# Patient Record
Sex: Male | Born: 1980 | Hispanic: Yes | Smoking: Never smoker
Health system: Southern US, Community
[De-identification: ages and names within clinical notes are randomized; demographics above are authoritative.]

## PROBLEM LIST (undated history)

## (undated) DIAGNOSIS — G4733 Obstructive sleep apnea (adult) (pediatric): Secondary | ICD-10-CM

## (undated) DIAGNOSIS — T7840XA Allergy, unspecified, initial encounter: Secondary | ICD-10-CM

## (undated) HISTORY — DX: Obstructive sleep apnea (adult) (pediatric): G47.33

## (undated) HISTORY — DX: Allergy, unspecified, initial encounter: T78.40XA

---

## 2006-05-25 ENCOUNTER — Emergency Department (HOSPITAL_COMMUNITY): Admission: EM | Admit: 2006-05-25 | Discharge: 2006-05-25 | Payer: Self-pay | Admitting: Emergency Medicine

## 2010-09-09 ENCOUNTER — Ambulatory Visit
Admission: RE | Admit: 2010-09-09 | Discharge: 2010-09-09 | Disposition: A | Discharge status: Home / Self Care | Payer: No Typology Code available for payment source | Source: Ambulatory Visit | Attending: Emergency Medicine | Admitting: Emergency Medicine

## 2010-09-09 ENCOUNTER — Other Ambulatory Visit: Payer: Self-pay | Admitting: Emergency Medicine

## 2010-09-09 DIAGNOSIS — R51 Headache: Secondary | ICD-10-CM

## 2012-03-01 ENCOUNTER — Ambulatory Visit: Payer: Self-pay | Admitting: Internal Medicine

## 2012-03-01 VITALS — BP 98/62 | HR 72 | Temp 98.3°F | Resp 14 | Ht 66.0 in | Wt 184.0 lb

## 2012-03-01 DIAGNOSIS — L301 Dyshidrosis [pompholyx]: Secondary | ICD-10-CM

## 2012-03-01 DIAGNOSIS — R21 Rash and other nonspecific skin eruption: Secondary | ICD-10-CM

## 2012-03-01 DIAGNOSIS — L739 Follicular disorder, unspecified: Secondary | ICD-10-CM

## 2012-03-01 MED ORDER — METHYLPREDNISOLONE ACETATE 40 MG/ML INJ SUSP (RADIOLOG
120.0000 mg | Freq: Once | INTRAMUSCULAR | Status: AC
  Administered 2012-03-01: 120 mg via INTRAMUSCULAR

## 2012-03-01 MED ORDER — DOXYCYCLINE HYCLATE 100 MG PO TABS: 100.0000 mg | ORAL_TABLET | Freq: Two times a day (BID) | ORAL | Status: AC

## 2012-03-01 MED ORDER — CLOBETASOL PROP EMOLLIENT BASE 0.05 % EX CREA: TOPICAL_CREAM | CUTANEOUS | Status: DC

## 2012-03-01 NOTE — Patient Instructions (Signed)
Foliculitis (Folliculitis) La foliculitis es una infeccin e inflamacin de los folculos pilosos. Estos se vuelven rojos e irritados y forman lesiones con pus. La foliculitis puede curarse por s misma en algunas semanas o durar ms tiempo y Chartered loss adjuster. CAUSAS La causa ms comn de la foliculitis es la infeccin ocasionada por una bacteria (germen) Las infecciones virales y los hongos tambin pueden causar este trastorno. Las infecciones virales pueden ser ms comunes en aquellos con el sistema inmunolgico (sistema del organismo que ayuda a Industrial/product designer las enfermedades ) debilitado, como los que sufren [place], los que han sido sometidos a un transplante de rganos y a los pacientes con Database administrator. Los ejemplos incluyen personas con:  SIDA.   Transplante de rganos.   Cncer.  Las personas con el sistema inmune deprimido, diabetes u obesidad, tienen un riesgo mayor de contraer foliculitis que la poblacin en general. Ciertas sustancias qumicas, especialmente aceites y alquitrn tambin pueden causar este trastorno. SNTOMAS  Un signo temprano de foliculitis es una pequea lesin que pica (pstula), que contiene pus blanco o amarillento, en un folculo inflamado y rojo. Generalmente miden menos de 5 mm. (0.20 pulgadas).   El lugar ms probable de comienzo es el cuero cabelludo, muslos, piernas, espalda y [place]. La foliculitis tambin aparece con frecuencia en las zonas de rasurado repetido.   Cuando la infeccin de un folculo se hace ms profunda, se forma un divieso o fornculo. Un grupo de diviesos apiados crean una lesin ms grande denominada ntrax (en ingls se denomina carbuncle). Las lesiones tienden a producirse en reas de sudoracin y que tienen gran cantidad de vello.  TRATAMIENTO  Generalmente los dermatlogos tratan los casos leves de foliculitis con antispticos combinados con antibiticos tpicos (una sustancia que se aplica en la piel y que destruye grmenes).   El  aceite de [address] o del rbol de t, que se obtiene en las herboristeras, tambin es un excelente antisptico tpico. Un pequeo porcentaje de individuos puede desarrollar alergia a este aceite.   Los fornculos de tamao pequeo o moderado responden bien a las compresas de agua tibia aplicadas tres veces por Futures trader.   En algunos casos el tratamiento de la piel deber acompaarse de antibiticos por va oral.   La eleccin del antibitico se realiza en base a la causa que se sospecha. Si las lesiones contienen grandes cantidades de pus o lquido, el profesional que lo asiste podr drenarlos. Esto permite que el antibitico llegue [employer] reas afectadas.   Los casos de foliculitis rebelde pueden responder bien a la extirpacin del pelo con Production manager. En este procedimiento se Aeronautical engineer (un haz de luz de alta intensidad) para destruir el folculo. De este modo se reducen las cicatrices que son resultado de este trastorno. No obstante, en el rea tratada no volver a crecer pelo.  Los pacientes que sufren foliculitis desde hace mucho tiempo y que no responde al tratamiento, pueden requerir estudios para Artist origen de la infeccin (de dnde proviene). Grmenes puede vivir en los orificios nasales del paciente, y Corporate investment banker brotes de manera intermitente. Algunas veces las bacterias viven en las fosas nasales de un miembro de la familia que no desarrolla la enfermedad, pero expone repetidamente al paciente a tener contacto con el germen. Para cortar este ciclo de recurrencia, ese familiar tambin debe someterse al tratamiento. PREVENCIN  Los individuos predispuestos a la foliculitis debern ser extremadamente cuidadosos en su higiene personal.   La aplicacin de un antisptico puede ayudar a prevenir  las recurrencias.   Una crema tpica con antibitico, mupirocina (Bactroban), ha demostrado su efectividad en la reduccin de la colonizacin bacteriana de las fosas nasales. Se aplica  dentro de la nariz con el dedo [place], [employer] por da, durante [place], y deber repetirse cada 6 meses.   Una persona que se expone con frecuencia al aceite, alquitrn o a otras sustancias qumicas irritantes deber evitarlas o usar Geologist, engineering. Los pacientes con diabetes, obesidad o compromiso del sistema inmunolgico (no funciona como debera Media planner), tienen que saber que la foliculitis es una consecuencia posible de su trastorno.  SOLICITE ATENCIN MDICA DE INMEDIATO SI:  Presenta enrojecimiento, hinchazn o aumento del dolor en la zona.   Tiene fiebre.   No mejora con el tratamiento o empeora.   Tiene otras preguntas o preocupaciones.  Document Released: 06/05/2005 Document Revised: 05/25/2011 [facility] Patient Information 2012 [place], [place].

## 2012-03-01 NOTE — Progress Notes (Signed)
  Subjective:    Patient ID: Adam Rojas, male    DOB: Aug 24, 1980, 31 y.o.   MRN: [id number]  HPI Itchy red bumps on palms, dorsal hands, right arm and axilla. No mouth lesions, No fever, not sick   Review of Systems     Objective:   Physical Exam Hands have tiny fluid vesicles, very itchy Arms have red papules and some with pustules       Assessment & Plan:  Folliculitis Dyshydrosis

## 2015-12-30 ENCOUNTER — Ambulatory Visit (INDEPENDENT_AMBULATORY_CARE_PROVIDER_SITE_OTHER): Payer: 59 | Admitting: Urgent Care

## 2015-12-30 VITALS — BP 118/80 | HR 54 | Temp 97.8°F | Resp 16 | Ht 67.0 in | Wt 177.0 lb

## 2015-12-30 DIAGNOSIS — M25531 Pain in right wrist: Secondary | ICD-10-CM

## 2015-12-30 DIAGNOSIS — G5603 Carpal tunnel syndrome, bilateral upper limbs: Secondary | ICD-10-CM | POA: Diagnosis not present

## 2015-12-30 DIAGNOSIS — M25532 Pain in left wrist: Secondary | ICD-10-CM

## 2015-12-30 MED ORDER — NAPROXEN SODIUM 550 MG PO TABS: 550.0000 mg | ORAL_TABLET | Freq: Two times a day (BID) | ORAL | Status: DC

## 2015-12-30 NOTE — Patient Instructions (Addendum)
Sndrome del tnel carpiano (Carpal Tunnel Syndrome) El sndrome del tnel carpiano es una afeccin que causa dolor en la mano y en el brazo. El tnel carpiano es un espacio estrecho ubicado en el lado palmar de la [place]. Los movimientos de la [place] o ciertas enfermedades pueden causar hinchazn del tnel. Esta hinchazn comprime el nervio principal de la mueca (nervio mediano). CAUSAS  Esta afeccin puede ser causada por lo siguiente:   Movimientos repetidos de [employer].  Lesiones en la [place].  Artritis.  Un quiste o un tumor en el tnel carpiano.  Acumulacin de lquido Solicitor. A veces, se desconoce la causa de esta afeccin.  FACTORES DE RIESGO Es ms probable que esta afeccin se manifieste en:   Las personas que tienen trabajos en los que deben [employer] mismos movimientos repetidos de las [place], como los carniceros y los cajeros.  Las mujeres.  Las personas que tienen determinadas enfermedades, por ejemplo:  Diabetes.  Obesidad.  Tiroides hipoactiva (hipotiroidismo).  Insuficiencia renal. SNTOMAS  Los sntomas de esta afeccin incluyen lo siguiente:   Sensacin de hormigueo en los dedos de la mano, Production assistant, radio, el ndice y el dedo [place].  Hormigueo o adormecimiento en la mano.  Sensacin de Social research officer, government en todo el brazo, especialmente cuando la [place] y el codo estn flexionados durante [place].  Dolor en la mueca que sube por el brazo hasta el hombro.  Dolor que baja por la mano o los dedos.  Sensacin de [employer]. Tal vez tenga dificultad para tomar y Licensed conveyancer. Los sntomas pueden empeorar durante la noche.  DIAGNSTICO  Esta afeccin se diagnostica mediante la historia clnica y un examen fsico. Tambin pueden hacerle exmenes, que incluyen los siguientes:   Electromiografa (EMG). Esta prueba mide las seales elctricas que los nervios les envan a los msculos.  Radiografas. TRATAMIENTO  El  tratamiento de esta afeccin incluye lo siguiente:  Cambios en el estilo de vida. Es importante dejar de Field seismologist o modificar la actividad que caus la afeccin.  Fisioterapia o terapia ocupacional.  Analgsicos y antiinflamatorios. Esto puede incluir medicamentos que se inyectan en la [place].  Una frula para la [place].  [place]uga. INSTRUCCIONES PARA EL CUIDADO EN EL HOGAR  Si tiene una frula:  sela como se lo haya indicado el mdico. Qutesela solamente como se lo haya indicado el mdico.  Afloje la frula si los dedos se le entumecen, siente hormigueos o se le enfran y se tornan de Optician, dispensing.  Mantenga la frula limpia y seca. Instrucciones generales  Delphi de venta libre y los recetados solamente como se lo haya indicado el mdico.  Haga reposar la [place] de toda actividad que le cause dolor. Si la afeccin tiene relacin con Leander Rams, hable con su empleador [employer] pueden [place], por [place], usar una almohadilla para apoyar la mueca mientras tipea.  Si se lo indican, aplique hielo sobre la zona dolorida:  Ponga el hielo en una bolsa plstica.  Coloque una toalla entre la piel y la bolsa de hielo.  Coloque el hielo durante 72minutos, 2 a 3veces por Training and development officer.  Concurra a todas las visitas de control como se lo haya indicado el mdico. Esto es importante.  Haga los ejercicios como se lo hayan indicado el mdico, el fisioterapeuta o el terapeuta ocupacional. SOLICITE ATENCIN MDICA SI:   Aparecen nuevos sntomas.  El dolor no se alivia con los [employer].  Los sntomas empeoran.  Esta informacin no tiene Marine scientist el consejo del mdico. Asegrese de hacerle al mdico cualquier pregunta que tenga.   Document Released: 06/05/2005 Document Revised: 02/24/2015 Elsevier Interactive Patient Education [employer].     IF you received an x-ray today, you will receive an invoice from [facility]. Please contact  [facility] at [phone] with questions or concerns regarding your invoice.   IF you received labwork today, you will receive an invoice from [employer]. Please contact [facility] at [phone] with questions or concerns regarding your invoice.   Our billing staff will not be able to assist you with questions regarding bills from these companies.  You will be contacted with the lab results as soon as they are available. The fastest way to get your results is to activate your My Chart account. Instructions are located on the last page of this paperwork. If you have not heard from [place] regarding the results in 2 weeks, please contact this office.

## 2015-12-30 NOTE — Progress Notes (Signed)
    MRN: [id number] DOB: 06/16/1981  Subjective:   Adam Rojas is a 35 y.o. male presenting for chief complaint of Hand Pain  Reports ~1 month history of bilateral worsening hand pain, L>R. Pain is located over wrist up to palm, is like a stiffness and pressure type sensation. Pain is worst at night after work but also occurs in the morning. Also has numbness and tingling of his left index and middle finger. Has tried APAP with minimal relief. Of note, his work is with Estate manager/land agent, had his work tasks changed recently to a lot of pulling, grasping tightly for long periods of time. Denies trauma, bony deformity, swelling, redness.  Antwan currently has no medications in their medication list. Also has No Known Allergies.  Dannel  has no past medical history on file. Also  has no past surgical history on file.  Objective:   Vitals: BP 118/80 mmHg  Pulse 54  Temp(Src) 97.8 F (36.6 C) (Oral)  Resp 16  Ht 5\' 7"  (1.702 m)  Wt 177 lb (80.287 kg)  BMI 27.72 kg/m2  SpO2 99%  Physical Exam  Constitutional: He is oriented to person, place, and time. He appears well-developed and well-nourished.  Cardiovascular: Normal rate.   Pulmonary/Chest: Effort normal.  Musculoskeletal:       Right wrist: He exhibits tenderness (over carpal tunnel, positive Tinnel). He exhibits normal range of motion, no bony tenderness, no swelling, no effusion, no crepitus, no deformity and no laceration.       Left wrist: He exhibits decreased range of motion (full flexion) and tenderness (over carpal tunnel, positive Tinnel). He exhibits no bony tenderness, no swelling, no effusion, no crepitus, no deformity and no laceration.  Neurological: He is alert and oriented to person, place, and time.  Skin: Skin is warm and dry.    Assessment and Plan :   1. Bilateral carpal tunnel syndrome 2. Pain in both wrists - Will manage conservatively for now. Wear wrist splint at night. Use Anaprox for pain and  inflammation. Work restrictions provided. F/u in 1 week, consider referral to OT or Ortho.  Jaynee Eagles, PA-C [facility] [phone] 12/30/2015 8:25 AM

## 2016-01-07 ENCOUNTER — Encounter: Payer: Self-pay | Admitting: Urgent Care

## 2016-01-07 ENCOUNTER — Ambulatory Visit (INDEPENDENT_AMBULATORY_CARE_PROVIDER_SITE_OTHER): Payer: 59 | Admitting: Urgent Care

## 2016-01-07 VITALS — BP 102/68 | HR 62 | Temp 98.1°F | Ht 67.0 in | Wt 183.0 lb

## 2016-01-07 DIAGNOSIS — G5603 Carpal tunnel syndrome, bilateral upper limbs: Secondary | ICD-10-CM | POA: Diagnosis not present

## 2016-01-07 DIAGNOSIS — R202 Paresthesia of skin: Secondary | ICD-10-CM | POA: Diagnosis not present

## 2016-01-07 DIAGNOSIS — M25531 Pain in right wrist: Secondary | ICD-10-CM | POA: Diagnosis not present

## 2016-01-07 DIAGNOSIS — R200 Anesthesia of skin: Secondary | ICD-10-CM

## 2016-01-07 DIAGNOSIS — M25532 Pain in left wrist: Secondary | ICD-10-CM | POA: Diagnosis not present

## 2016-01-07 NOTE — Progress Notes (Signed)
    MRN: [id number] DOB: 01-09-1981  Subjective:   Adam Rojas is a 35 y.o. male presenting for follow up on carpal tunnel syndrome.   Patient was initially seen on 12/30/2015, started conservative management, wrist splints, provided with work restrictions. Today, he reports doing much better. His wrist pain is resolved, still has some tingling in his left middle and index fingers but this has also improved. He has been using wrist splint and taking Anaprox. However, he noticed dramatic improvement when he stopped working his previous job. Unfortunately, he had to quit since his employer refused to follow the work restrictions. Fortunately, he will be starting a new job that he expects will be much lighter on his wrists.  Kelly has a current medication list which includes the following prescription(s): naproxen sodium. Also has No Known Allergies.  Herson  has no past medical history on file. Also  has no past surgical history on file.  Objective:   Vitals: BP 102/68 mmHg  Pulse 62  Temp(Src) 98.1 F (36.7 C) (Oral)  Ht 5\' 7"  (1.702 m)  Wt 183 lb (83.008 kg)  BMI 28.66 kg/m2  SpO2 98%  Physical Exam  Constitutional: He is oriented to person, place, and time. He appears well-developed and well-nourished.  Cardiovascular: Normal rate.   Pulmonary/Chest: Effort normal.  Musculoskeletal:       Right wrist: He exhibits normal range of motion, no tenderness, no bony tenderness, no swelling, no effusion, no crepitus, no deformity and no laceration.       Left wrist: He exhibits normal range of motion, no tenderness, no bony tenderness, no swelling, no effusion, no crepitus, no deformity and no laceration.  Positive Tinnel test for left and right wrist.  Neurological: He is alert and oriented to person, place, and time.   Assessment and Plan :   1. Bilateral carpal tunnel syndrome 2. Pain in both wrists 3. Numbness and tingling - Improved, continue conservative management, wear  wrist splint. Patient will let me know if his tingling of his left 2nd and 3rd fingers persists. Consider PT at that point. Patient agreed.  Jaynee Eagles, PA-C [facility] [phone] 01/07/2016 8:48 AM

## 2016-01-07 NOTE — Patient Instructions (Addendum)
Sndrome del tnel carpiano (Carpal Tunnel Syndrome) El sndrome del tnel carpiano es una afeccin que causa dolor en la mano y en el brazo. El tnel carpiano es un espacio estrecho ubicado en el lado palmar de la [place]. Los movimientos de la [place] o ciertas enfermedades pueden causar hinchazn del tnel. Esta hinchazn comprime el nervio principal de la mueca (nervio mediano). CAUSAS  Esta afeccin puede ser causada por lo siguiente:   Movimientos repetidos de [employer].  Lesiones en la [place].  Artritis.  Un quiste o un tumor en el tnel carpiano.  Acumulacin de lquido Solicitor. A veces, se desconoce la causa de esta afeccin.  FACTORES DE RIESGO Es ms probable que esta afeccin se manifieste en:   Las personas que tienen trabajos en los que deben [employer]s mismos movimientos repetidos de las [place], como los carniceros y los cajeros.  Las mujeres.  Las personas que tienen determinadas enfermedades, por ejemplo:  Diabetes.  Obesidad.  Tiroides hipoactiva (hipotiroidismo).  Insuficiencia renal. SNTOMAS  Los sntomas de esta afeccin incluyen lo siguiente:   Sensacin de hormigueo en los dedos de la mano, Production assistant, radio, el ndice y el dedo [place].  Hormigueo o adormecimiento en la mano.  Sensacin de Social research officer, government en todo el brazo, especialmente cuando la [place] y el codo estn flexionados durante [place].  Dolor en la mueca que sube por el brazo hasta el hombro.  Dolor que baja por la mano o los dedos.  Sensacin de [employer]vin[employer]itor. Tal vez tenga dificultad para tomar y Licensed conveyancer. Los sntomas pueden empeorar durante la noche.  DIAGNSTICO  Esta afeccin se diagnostica mediante la historia clnica y un examen fsico. Tambin pueden hacerle exmenes, que incluyen los siguientes:   Electromiografa (EMG). Esta prueba mide las seales elctricas que los nervios les envan a los msculos.  Radiografas. TRATAMIENTO  El  tratamiento de esta afeccin incluye lo siguiente:  Cambios en el estilo de vida. Es importante dejar de Field seismologist o modificar la actividad que caus la afeccin.  Fisioterapia o terapia ocupacional.  Analgsicos y antiinflamatorios. Esto puede incluir medicamentos que se inyectan en la [place].  Una frula para la [place].  [place]. INSTRUCCIONES PARA EL CUIDADO EN EL HOGAR  Si tiene una frula:  sela como se lo haya indicado el mdico. Qutesela solamente como se lo haya indicado el mdico.  Afloje la frula si los dedos se le entumecen, siente hormigueos o se le enfran y se tornan de Optician, dispensing.  Mantenga la frula limpia y seca. Instrucciones generales  Delphi de venta libre y los recetados solamente como se lo haya indicado el mdico.  Haga reposar la [place] de toda actividad que le cause dolor. Si la afeccin tiene relacin con Leander Rams, hable con su empleador [employer] Financial pueden [place], por [place], usar una almohadilla para apoyar la mueca mientras tipea.  Si se lo indican, aplique hielo sobre la zona dolorida:  Ponga el hielo en una bolsa plstica.  Coloque una toalla entre la piel y la bolsa de hielo.  Coloque el hielo durante 48minutos, 2 a 3veces por Training and development officer.  Concurra a todas las visitas de control como se lo haya indicado el mdico. Esto es importante.  Haga los ejercicios como se lo hayan indicado el mdico, el fisioterapeuta o el terapeuta ocupacional. SOLICITE ATENCIN MDICA SI:   Aparecen nuevos sntomas.  El dolor no se alivia con los [employer]ne[employer].  Los sntomas empeoran.  Esta informacin no tiene Marine scientist el consejo del mdico. Asegrese de hacerle al mdico cualquier pregunta que tenga.   Document Released: 06/05/2005 Document Revised: 02/24/2015 Elsevier Interactive Patient Education [employer].     IF you received an x-ray today, you will receive an invoice from [facility] Rad[facility]. Please contact  [facility]iology at [phone] with questions or concerns regarding your invoice.   IF you received labwork today, you will receive an invoice from [employer]. Please contact [facility] at [phone] with questions or concerns regarding your invoice.   Our billing staff will not be able to assist you with questions regarding bills from these companies.  You will be contacted with the lab results as soon as they are available. The fastest way to get your results is to activate your My Chart account. Instructions are located on the last page of this paperwork. If you have not heard from [place] regarding the results in 2 weeks, please contact this office.

## 2018-05-20 ENCOUNTER — Ambulatory Visit (INDEPENDENT_AMBULATORY_CARE_PROVIDER_SITE_OTHER): Payer: BLUE CROSS/BLUE SHIELD

## 2018-05-20 ENCOUNTER — Other Ambulatory Visit: Payer: Self-pay

## 2018-05-20 ENCOUNTER — Ambulatory Visit (INDEPENDENT_AMBULATORY_CARE_PROVIDER_SITE_OTHER): Payer: BLUE CROSS/BLUE SHIELD | Admitting: Family Medicine

## 2018-05-20 ENCOUNTER — Encounter: Payer: Self-pay | Admitting: Family Medicine

## 2018-05-20 VITALS — BP 120/75 | HR 63 | Temp 98.6°F | Ht 67.0 in | Wt 179.8 lb

## 2018-05-20 DIAGNOSIS — Z23 Encounter for immunization: Secondary | ICD-10-CM

## 2018-05-20 DIAGNOSIS — M25571 Pain in right ankle and joints of right foot: Secondary | ICD-10-CM | POA: Diagnosis not present

## 2018-05-20 DIAGNOSIS — S99911A Unspecified injury of right ankle, initial encounter: Secondary | ICD-10-CM | POA: Diagnosis not present

## 2018-05-20 MED ORDER — IBUPROFEN 600 MG PO TABS: 600.0000 mg | ORAL_TABLET | Freq: Three times a day (TID) | ORAL | 0 refills | Status: DC | PRN

## 2018-05-20 NOTE — Patient Instructions (Addendum)
     If you have lab work done today you will be contacted with your lab results within the next 2 weeks.  If you have not heard from us then please contact us. The fastest way to get your results is to register for My Chart.   IF you received an x-ray today, you will receive an invoice from [facility]. Please contact [facility] at [phone] with questions or concerns regarding your invoice.   IF you received labwork today, you will receive an invoice from [facility]. Please contact [facility] at [phone] with questions or concerns regarding your invoice.   Our billing staff will not be able to assist you with questions regarding bills from these companies.  You will be contacted with the lab results as soon as they are available. The fastest way to get your results is to activate your My Chart account. Instructions are located on the last page of this paperwork. If you have not heard from us regarding the results in 2 weeks, please contact this office.     Ankle Sprain An ankle sprain is a stretch or tear in one of the tough tissues (ligaments) in your ankle. Follow these instructions at home:  Rest your ankle.  Take over-the-counter and prescription medicines only as told by your doctor.  For 2-3 days, keep your ankle higher than the level of your heart (elevated) as much as possible.  If directed, put ice on the area: ? Put ice in a plastic bag. ? Place a towel between your skin and the bag. ? Leave the ice on for 20 minutes, 2-3 times a day.  If you were given a brace: ? Wear it as told. ? Take it off to shower or bathe. ? Try not to move your ankle much, but wiggle your toes from time to time. This helps to prevent swelling.  If you were given an elastic bandage (dressing): ? Take it off when you shower or bathe. ? Try not to move your ankle much, but wiggle your toes from time to time. This helps to prevent swelling. ? Adjust the bandage to make it  more comfortable if it feels too tight. ? Loosen the bandage if you lose feeling in your foot, your foot tingles, or your foot gets cold and blue.  If you have crutches, use them as told by your doctor. Continue to use them until you can walk without feeling pain in your ankle. Contact a doctor if:  Your bruises or swelling are quickly getting worse.  Your pain does not get better after you take medicine. Get help right away if:  You cannot feel your toes or foot.  Your toes or your foot looks blue.  You have very bad pain that gets worse. This information is not intended to replace advice given to you by your health care provider. Make sure you discuss any questions you have with your health care provider. Document Released: 11/22/2007 Document Revised: 11/11/2015 Document Reviewed: 01/05/2015 Elsevier Interactive Patient Education  2018 Elsevier Inc.       

## 2018-05-20 NOTE — Progress Notes (Signed)
   12/2/20193:01 PM  Adam Rojas [birth year], 37 y.o. male [id number]  Chief Complaint  Patient presents with  . Pain    fell last night gong down steps, thinks he may have sprained right anke. Taking tylenol for the pain, and elevation    HPI:   Patient is a 37 y.o. male who presents today for right ankle pain  Last night going down steps, missed step and fell Tried to walk on it but very painful, needed help getting back inside  Pain along both sides of ankle Having swelling He has been elevating  Took tylenol No previous injuries Unable to bear weight on this ankle  Fall Risk  05/20/2018 01/07/2016 12/30/2015  Falls in the past year? 1 No No  Number falls in past yr: 0 - -  Injury with Fall? 1 - -     Depression screen [facility] 2/9 05/20/2018 01/07/2016 12/30/2015  Decreased Interest 0 0 0  Down, Depressed, Hopeless 0 0 0  PHQ - 2 Score 0 0 0    No Known Allergies  Prior to Admission medications   Not on File    History reviewed. No pertinent past medical history.  History reviewed. No pertinent surgical history.  Social History   Tobacco Use  . Smoking status: Never Smoker  . Smokeless tobacco: Never Used  Substance Use Topics  . Alcohol use: Not Currently    Alcohol/week: 0.0 standard drinks    History reviewed. No pertinent family history.  ROS Per hpi  OBJECTIVE:  Blood pressure 120/75, pulse 63, temperature 98.6 F (37 C), temperature source Oral, height 5\' 7"  (1.702 m), weight 179 lb 12.8 oz (81.6 kg), SpO2 96 %. Body mass index is 28.16 kg/m.   Physical Exam  Gen: AAOx3, NAD Right ankle: swelling along lateral aspect. TTP along distal end of both malleolus. Decreased ROM with inversion. NVI  Dg Ankle Complete Right  Result Date: 05/20/2018 CLINICAL DATA:  [place] yesterday.  Ankle pain. EXAM: RIGHT ANKLE - COMPLETE 3+ VIEW COMPARISON:  None. FINDINGS: There is no evidence of fracture, dislocation, or joint effusion. There is no evidence of  arthropathy or other focal bone abnormality. Soft tissues are unremarkable. IMPRESSION: Negative. Electronically Signed   By: Nelson Chimes M.D.   On: 05/20/2018 15:20    ASSESSMENT and PLAN  1. Acute right ankle pain Discussed supportive measures, new meds r/se/b and RTC precautions. Patient educational handout given. - DG Ankle Complete Right; Future - Apply ASO ankle  2. Need for prophylactic vaccination and inoculation against influenza - Flu Vaccine QUAD 36+ mos IM  Other orders - ibuprofen (ADVIL,MOTRIN) 600 MG tablet; Take 1 tablet (600 mg total) by mouth every 8 (eight) hours as needed.  Return if symptoms worsen or fail to improve.    Rutherford Guys, MD [facility], [place] [address] Ph.  [phone] Fax [phone]

## 2019-01-06 ENCOUNTER — Encounter: Payer: Self-pay | Admitting: Registered Nurse

## 2019-01-06 ENCOUNTER — Other Ambulatory Visit: Payer: Self-pay

## 2019-01-06 ENCOUNTER — Ambulatory Visit (INDEPENDENT_AMBULATORY_CARE_PROVIDER_SITE_OTHER): Payer: BC Managed Care – PPO | Admitting: Registered Nurse

## 2019-01-06 VITALS — BP 126/79 | HR 58 | Temp 98.9°F | Resp 16 | Ht 67.32 in | Wt 180.0 lb

## 2019-01-06 DIAGNOSIS — Z13228 Encounter for screening for other metabolic disorders: Secondary | ICD-10-CM

## 2019-01-06 DIAGNOSIS — Z0000 Encounter for general adult medical examination without abnormal findings: Secondary | ICD-10-CM

## 2019-01-06 DIAGNOSIS — Z130 Encounter for screening for diseases of the blood and blood-forming organs and certain disorders involving the immune mechanism: Secondary | ICD-10-CM

## 2019-01-06 DIAGNOSIS — Z7689 Persons encountering health services in other specified circumstances: Secondary | ICD-10-CM | POA: Diagnosis not present

## 2019-01-06 DIAGNOSIS — R21 Rash and other nonspecific skin eruption: Secondary | ICD-10-CM | POA: Diagnosis not present

## 2019-01-06 DIAGNOSIS — D171 Benign lipomatous neoplasm of skin and subcutaneous tissue of trunk: Secondary | ICD-10-CM

## 2019-01-06 DIAGNOSIS — Z0001 Encounter for general adult medical examination with abnormal findings: Secondary | ICD-10-CM | POA: Diagnosis not present

## 2019-01-06 DIAGNOSIS — Z1329 Encounter for screening for other suspected endocrine disorder: Secondary | ICD-10-CM | POA: Diagnosis not present

## 2019-01-06 DIAGNOSIS — Z13220 Encounter for screening for lipoid disorders: Secondary | ICD-10-CM

## 2019-01-06 NOTE — Patient Instructions (Addendum)
Encourage use of hydrocortisone for rash on legs, appears to be likely eczema.     If you have lab work done today you will be contacted with your lab results within the next 2 weeks.  If you have not heard from [place] then please contact us. The fastest way to get your results is to register for My Chart.   IF you received an x-ray today, you will receive an invoice from [facility]. Please contact [facility] at [phone] with questions or concerns regarding your invoice.   IF you received labwork today, you will receive an invoice from [place]. Please contact [facility] at [phone] with questions or concerns regarding your invoice.   Our billing staff will not be able to assist you with questions regarding bills from these companies.  You will be contacted with the lab results as soon as they are available. The fastest way to get your results is to activate your My Chart account. Instructions are located on the last page of this paperwork. If you have not heard from [place] regarding the results in 2 weeks, please contact this office.       Atopic Dermatitis Atopic dermatitis is a skin disorder that causes inflammation of the skin. This is the most common type of eczema. Eczema is a group of skin conditions that cause the skin to be itchy, red, and swollen. This condition is generally worse during the cooler winter months and often improves during the warm summer months. Symptoms can vary from person to person. Atopic dermatitis usually starts showing signs in infancy and can last through adulthood. This condition cannot be passed from one person to another (non-contagious), but it is more common in families. Atopic dermatitis may not always be present. When it is present, it is called a flare-up. What are the causes? The exact cause of this condition is not known. Flare-ups of the condition may be triggered by:  Contact with something that you are sensitive or allergic  to.  Stress.  Certain foods.  Extremely hot or cold weather.  Harsh chemicals and soaps.  Dry air.  Chlorine. What increases the risk? This condition is more likely to develop in people who have a personal history or family history of eczema, allergies, asthma, or hay fever. What are the signs or symptoms? Symptoms of this condition include:  Dry, scaly skin.  Red, itchy rash.  Itchiness, which can be severe. This may occur before the skin rash. This can make sleeping difficult.  Skin thickening and cracking that can occur over time. How is this diagnosed? This condition is diagnosed based on your symptoms, a medical history, and a physical exam. How is this treated? There is no cure for this condition, but symptoms can usually be controlled. Treatment focuses on:  Controlling the itchiness and scratching. You may be given medicines, such as antihistamines or steroid creams.  Limiting exposure to things that you are sensitive or allergic to (allergens).  Recognizing situations that cause stress and developing a plan to manage stress. If your atopic dermatitis does not get better with medicines, or if it is all over your body (widespread), a treatment using a specific type of light (phototherapy) may be used. Follow these instructions at home: Skin care   Keep your skin well-moisturized. Doing this seals in moisture and helps to prevent dryness. ? Use unscented lotions that have petroleum in them. ? Avoid lotions that contain alcohol or water. They can dry the skin.  Keep baths or  showers short (less than 5 minutes) in warm water. Do not use hot water. ? Use mild, unscented cleansers for bathing. Avoid soap and bubble bath. ? Apply a moisturizer to your skin right after a bath or shower.  Do not apply anything to your skin without checking with your health care provider. General instructions  Dress in clothes made of cotton or cotton blends. Dress lightly because  heat increases itchiness.  When washing your clothes, rinse your clothes twice so all of the soap is removed.  Avoid any triggers that can cause a flare-up.  Try to manage your stress.  Keep your fingernails cut short.  Avoid scratching. Scratching makes the rash and itchiness worse. It may also result in a skin infection (impetigo) due to a break in the skin caused by scratching.  Take or apply over-the-counter and prescription medicines only as told by your health care provider.  Keep all follow-up visits as told by your health care provider. This is important.  Do not be around people who have cold sores or fever blisters. If you get the infection, it may cause your atopic dermatitis to worsen. Contact a health care provider if:  Your itchiness interferes with sleep.  Your rash gets worse or it is not better within one week of starting treatment.  You have a fever.  You have a rash flare-up after having contact with someone who has cold sores or fever blisters. Get help right away if:  You develop pus or soft yellow scabs in the rash area. Summary  This condition causes a red rash and itchy, dry, scaly skin.  Treatment focuses on controlling the itchiness and scratching, limiting exposure to things that you are sensitive or allergic to (allergens), recognizing situations that cause stress, and developing a plan to manage stress.  Keep your skin well-moisturized.  Keep baths or showers shorter than 5 minutes and use warm water. Do not use hot water. This information is not intended to replace advice given to you by your health care provider. Make sure you discuss any questions you have with your health care provider. Document Released: 06/02/2000 Document Revised: 09/24/2018 Document Reviewed: 07/07/2016 Elsevier Patient Education  [place].  Eczema Eczema is a broad term for a group of skin conditions that cause skin to become rough and inflamed. Each type of  eczema has different triggers, symptoms, and treatments. Eczema of any type is usually itchy and symptoms range from mild to severe. Eczema and its symptoms are not spread from person to person (are not contagious). It can appear on different parts of the body at different times. Your eczema may not look the same as someone else's eczema. What are the types of eczema? Atopic dermatitis This is a long-term (chronic) skin disease that keeps coming back (recurring). Usual symptoms are dry skin and small, solid pimples that may swell and leak fluid (weep). Contact dermatitis  This happens when something irritates the skin and causes a rash. The irritation can come from substances that you are allergic to (allergens), such as poison ivy, chemicals, or medicines that were applied to your skin. Dyshidrotic eczema This is a form of eczema on the hands and feet. It shows up as very itchy, fluid-filled blisters. It can affect people of any age, but is more common before age 56. Hand eczema  This causes very itchy areas of skin on the palms and sides of the hands and fingers. This type of eczema is common in industrial jobs  where you may be exposed to many different types of irritants. Lichen simplex chronicus This type of eczema occurs when a person constantly scratches one area of the body. Repeated scratching of the area leads to thickened skin (lichenification). Lichen simplex chronicus can occur along with other types of eczema. It is more common in adults, but may be seen in children as well. Nummular eczema This is a common type of eczema. It has no known cause. It typically causes a red, circular, crusty lesion (plaque) that may be itchy. Scratching may become a habit and can cause bleeding. Nummular eczema occurs most often in people of middle-age or older. It most often affects the hands. Seborrheic dermatitis This is a common skin disease that mainly affects the scalp. It may also affect any oily  areas of the body, such as the face, sides of nose, eyebrows, ears, eyelids, and chest. It is marked by small scaling and redness of the skin (erythema). This can affect people of all ages. In infants, this condition is known as Chartered certified accountant." Stasis dermatitis This is a common skin disease that usually appears on the legs and feet. It most often occurs in people who have a condition that prevents blood from being pumped through the veins in the legs (chronic venous insufficiency). Stasis dermatitis is a chronic condition that needs long-term management. How is eczema diagnosed? Your health care provider will examine your skin and review your medical history. He or she may also give you skin patch tests. These tests involve taking patches that contain possible allergens and placing them on your back. He or she will then check in a few days to see if an allergic reaction occurred. What are the common treatments? Treatment for eczema is based on the type of eczema you have. Hydrocortisone steroid medicine can relieve itching quickly and help reduce inflammation. This medicine may be prescribed or obtained over-the-counter, depending on the strength of the medicine that is needed. Follow these instructions at home:  Take over-the-counter and prescription medicines only as told by your health care provider.  Use creams or ointments to moisturize your skin. Do not use lotions.  Learn what triggers or irritates your symptoms. Avoid these things.  Treat symptom flare-ups quickly.  Do not itch your skin. This can make your rash worse.  Keep all follow-up visits as told by your health care provider. This is important. Where to find more information  The American Academy of Dermatology: http://jones-macias.info/  The National Eczema Association: www.nationaleczema.org Contact a health care provider if:  You have serious itching, even with treatment.  You regularly scratch your skin until it bleeds.  Your rash  looks different than usual.  Your skin is painful, swollen, or more red than usual.  You have a fever. Summary  There are eight general types of eczema. Each type has different triggers.  Eczema of any type causes itching that may range from mild to severe.  Treatment varies based on the type of eczema you have. Hydrocortisone steroid medicine can help with itching and inflammation.  Protecting your skin is the best way to prevent eczema. Use moisturizers and lotions. Avoid triggers and irritants, and treat flare-ups quickly. This information is not intended to replace advice given to you by your health care provider. Make sure you discuss any questions you have with your health care provider. Document Released: 10/19/2016 Document Revised: 05/18/2017 Document Reviewed: 10/19/2016 Elsevier Patient Education  [place]. Hydrocortisone skin cream, ointment, lotion, or solution  What is this medicine? HYDROCORTISONE (hye droe KOR ti sone) is a corticosteroid. It is used on the skin to reduce swelling, redness, itching, and allergic reactions. This medicine may be used for other purposes; ask your health care provider or pharmacist if you have questions. COMMON BRAND NAME(S): Ala-Cort, Ala-Scalp, Anusol HC, Aqua Glycolic HC, Balneol for Her, Caldecort, Cetacort, Cortaid, Cortaid Advanced, Cortaid Intensive Therapy, Cortaid Sensitive Skin, CortAlo, Corticaine, Corticool, Cortizone, Cortizone-10, Cortizone-10 Cooling Relief, Cortizone-10 Intensive Healing, Cortizone-10 Plus, Dermarest Dricort, Dermarest Eczema, DERMASORB HC Complete, Gly-Cort, Hycort, Hydro Skin, Hydrocortisone in Absorbase, Hydroskin, Hytone, Instacort, Lacticare HC, Locoid, Locoid Lipocream, MiCort-HC, Monistat Complete Care Instant Itch Relief Cream, Neosporin Eczema, NuCort, Nutracort, NuZon, Pandel, Pediaderm HC, Penecort, Preparation H Hydrocortisone, Procto-Kit, Procto-Med HC, Procto-Pak, Proctocort, Proctocream-HC,  Proctosol-HC, Proctozone-HC, Rederm, Sarnol-HC, Nurse, adult, Engineer, site, [place], Tucks HC, Vagisil Anti-Itch, [facility] Intensive Healing, Human resources officer What should I tell my health care provider before I take this medicine? They need to know if you have any of these conditions:  any active infection  large areas of burned or damaged skin  skin wasting or thinning  an unusual or allergic reaction to hydrocortisone, corticosteroids, sulfites, other medicines, foods, dyes, or preservatives  pregnant or trying to get pregnant  breast-feeding How should I use this medicine? This medicine is for external use only. Do not take by mouth. Follow the directions on the prescription label. Wash your hands before and after use. Apply a thin film of medicine to the affected area. Do not cover with a bandage or dressing unless your doctor or health care professional tells you to. Do not use on healthy skin or over large areas of skin. Do not get this medicine in your eyes. If you do, rinse out with plenty of cool tap water. Do not to use more medicine than prescribed. Do not use your medicine more often than directed or for more than 14 days. Talk to your pediatrician regarding the use of this medicine in children. Special care may be needed. While this drug may be prescribed for children as young as 30 years of age for selected conditions, precautions do apply. Do not use this medicine for the treatment of diaper rash unless directed to do so by your doctor or health care professional. If applying this medicine to the diaper area of a child, do not cover with tight-fitting diapers or plastic pants. This may increase the amount of medicine that passes through the skin and increase the risk of serious side effects. Elderly patients are more likely to have damaged skin through aging, and this may increase side effects. This medicine should only be used for brief periods and infrequently in older  patients. Overdosage: If you think you have taken too much of this medicine contact a poison control center or emergency room at once. NOTE: This medicine is only for you. Do not share this medicine with others. What if I miss a dose? If you miss a dose, use it as soon as you can. If it is almost time for your next dose, use only that dose. Do not use double or extra doses. What may interact with this medicine? Interactions are not expected. Do not use any other skin products on the affected area without asking your doctor or health care professional. This list may not describe all possible interactions. Give your health care provider a list of all the medicines, herbs, non-prescription drugs, or dietary supplements you use. Also tell them if you smoke, drink alcohol, or  use illegal drugs. Some items may interact with your medicine. What should I watch for while using this medicine? Tell your doctor or health care professional if your symptoms do not start to get better within 7 days or if they get worse. Tell your doctor or health care professional if you are exposed to anyone with measles or chickenpox, or if you develop sores or blisters that do not heal properly. What side effects may I notice from receiving this medicine? Side effects that you should report to your doctor or health care professional as soon as possible:  allergic reactions like skin rash, itching or hives, swelling of the face, lips, or tongue  burning feeling on the skin  dark red spots on the skin  infection  lack of healing of skin condition  painful, red, pus filled blisters in hair follicles  thinning of the skin Side effects that usually do not require medical attention (report to your doctor or health care professional if they continue or are bothersome):  dry skin, irritation  unusual increased growth of hair on the face or body This list may not describe all possible side effects. Call your doctor for  medical advice about side effects. You may report side effects to FDA at 1-800-FDA-1088. Where should I keep my medicine? Keep out of the reach of children. Store at room temperature between 15 and 30 degrees C (59 and 86 degrees F). Do not freeze. Throw away any unused medicine after the expiration date. NOTE: This sheet is a summary. It may not cover all possible information. If you have questions about this medicine, talk to your doctor, pharmacist, or health care provider.  2020 Elsevier/Gold Standard (2018-03-06 12:12:55)

## 2019-01-06 NOTE — Progress Notes (Signed)
Established Patient Office Visit  Subjective:  Patient ID: Adam Rojas, male    DOB: 30-Mar-1981  Age: 38 y.o. MRN: [id number]  CC:  Chief Complaint  Patient presents with  . Annual Exam    HPI Adam Rojas presents for CPE, lump on hip, rash on lower legs, and difficulty sleeping.  Lump on hip: noticed since childhood. However, lately it has been irritating him. It is on his belt line and it has been troublesome to work with. He states it has not given any discharge, felt warm, red, or spread.  Rash on lower legs: hyperpigmented, coalesced, flat, itchy. Does not spread. Comes and goes.  Difficulty sleeping: wakes groggy, wakes with headaches, daytime sleepiness. Occurred increasingly over past month. Is not aware on whether or not he snores.   Past Medical History:  Diagnosis Date  . Allergy   . OSA (obstructive sleep apnea)     History reviewed. No pertinent surgical history.  Family History  Problem Relation Age of Onset  . Hyperlipidemia Mother   . Diabetes Father     Social History   Socioeconomic History  . Marital status: Married    Spouse name: Not on file  . Number of children: 1  . Years of education: Not on file  . Highest education level: Not on file  Occupational History  . Occupation: factory  Social Needs  . Financial resource strain: Not hard at all  . Food insecurity    Worry: Never true    Inability: Never true  . Transportation needs    Medical: No    Non-medical: No  Tobacco Use  . Smoking status: Never Smoker  . Smokeless tobacco: Never Used  Substance and Sexual Activity  . Alcohol use: Not Currently    Alcohol/week: 0.0 standard drinks  . Drug use: Never  . Sexual activity: Yes  Lifestyle  . Physical activity    Days per week: 5 days    Minutes per session: 30 min  . Stress: Only a little  Relationships  . Social Herbalist on phone: Twice a week    Gets together: Once a week    Attends religious  service: Never    Active member of club or organization: No    Attends meetings of clubs or organizations: Never    Relationship status: Living with partner  . Intimate partner violence    Fear of current or ex partner: No    Emotionally abused: No    Physically abused: No    Forced sexual activity: No  Other Topics Concern  . Not on file  Social History Narrative  . Not on file    Outpatient Medications Prior to Visit  Medication Sig Dispense Refill  . ibuprofen (ADVIL,MOTRIN) 600 MG tablet Take 1 tablet (600 mg total) by mouth every 8 (eight) hours as needed. 30 tablet 0   No facility-administered medications prior to visit.     No Known Allergies  ROS Review of Systems  Constitutional: Positive for fatigue.  HENT: Negative.   Eyes: Negative.   Respiratory: Negative.   Cardiovascular: Negative.   Gastrointestinal: Negative.   Endocrine: Negative.   Genitourinary: Negative.   Musculoskeletal: Negative.   Skin: Positive for rash.  Allergic/Immunologic: Negative.   Neurological: Negative.   Hematological: Negative.   Psychiatric/Behavioral: Negative.   All other systems reviewed and are negative.     Objective:    Physical Exam  Constitutional: He is oriented  to person, place, and time. He appears well-developed and well-nourished. No distress.  HENT:  Head: Normocephalic and atraumatic.  Right Ear: External ear normal.  Left Ear: External ear normal.  Nose: Nose normal.  Mouth/Throat: Oropharynx is clear and moist. No oropharyngeal exudate.  Eyes: Pupils are equal, round, and reactive to light. Conjunctivae and EOM are normal. Right eye exhibits no discharge. Left eye exhibits no discharge. No scleral icterus.  Neck: Normal range of motion. Neck supple. No tracheal deviation present. No thyromegaly present.  Cardiovascular: Normal rate, regular rhythm, normal heart sounds and intact distal pulses. Exam reveals no gallop and no friction rub.  No murmur heard.  Pulmonary/Chest: Effort normal and breath sounds normal. No respiratory distress. He has no wheezes. He has no rales. He exhibits no tenderness.  Abdominal: Soft. Bowel sounds are normal. He exhibits no distension. There is no abdominal tenderness.  Musculoskeletal: Normal range of motion.        General: No tenderness, deformity or edema.  Lymphadenopathy:    He has no cervical adenopathy.  Neurological: He is alert and oriented to person, place, and time. No cranial nerve deficit.  Skin: Skin is warm and dry. No rash noted. He is not diaphoretic. No erythema. No pallor.     Psychiatric: He has a normal mood and affect. His behavior is normal. Judgment and thought content normal.  Nursing note and vitals reviewed.   BP 126/79   Pulse (!) 58   Temp 98.9 F (37.2 C) (Oral)   Resp 16   Ht 5' 7.32" (1.71 m)   Wt 180 lb (81.6 kg)   SpO2 97%   BMI 27.92 kg/m  Wt Readings from Last 3 Encounters:  01/06/19 180 lb (81.6 kg)  05/20/18 179 lb 12.8 oz (81.6 kg)  01/07/16 183 lb (83 kg)     Health Maintenance Due  Topic Date Due  . HIV Screening  09/02/1996  . TETANUS/TDAP  09/02/2000    There are no preventive care reminders to display for this patient.  No results found for: TSH No results found for: WBC, HGB, HCT, MCV, PLT No results found for: NA, K, CHLORIDE, CO2, GLUCOSE, BUN, CREATININE, BILITOT, ALKPHOS, AST, ALT, PROT, ALBUMIN, CALCIUM, ANIONGAP, EGFR, GFR No results found for: CHOL No results found for: HDL No results found for: LDLCALC No results found for: TRIG No results found for: CHOLHDL No results found for: HGBA1C    Assessment & Plan:   Problem List Items Addressed This Visit      Musculoskeletal and Integument   Lipoma of skin and subcutaneous tissue of trunk    Other Visit Diagnoses    Encounter to establish care    -  Primary   Normal physical exam, routine       Screening for endocrine, metabolic and immunity disorder       Relevant Orders    CBC with Differential/Platelet   Comprehensive metabolic panel   Hemoglobin A1c   TSH   Lipid screening       Relevant Orders   Lipid panel   Lipoma of torso       Relevant Orders   Ambulatory referral to Dermatology      No orders of the defined types were placed in this encounter.   Follow-up: Return in about 1 year (around 01/06/2020) for CPE and labs.   PLAN:  Refer to derm for lump assessment and removal  Hydrocortisone for eczema  Labs drawn, will follow up as needed  Monitor sleep - if snoring, likely OSA. Weight loss, no alcohol before bed, sleep hygiene all reviewed. Will order sleep study if his symptoms continue  Return in 1 year for CPE and labs  Patient encouraged to call clinic with any questions, comments, or concerns.     Maximiano Coss, NP

## 2019-01-07 LAB — COMPREHENSIVE METABOLIC PANEL
ALT: 48 IU/L — ABNORMAL HIGH (ref 0–44)
AST: 24 IU/L (ref 0–40)
Albumin/Globulin Ratio: 2.3 — ABNORMAL HIGH (ref 1.2–2.2)
Albumin: 5 g/dL (ref 4.0–5.0)
Alkaline Phosphatase: 54 IU/L (ref 39–117)
BUN/Creatinine Ratio: 13 (ref 9–20)
BUN: 12 mg/dL (ref 6–20)
Bilirubin Total: 0.4 mg/dL (ref 0.0–1.2)
CO2: 16 mmol/L — ABNORMAL LOW (ref 20–29)
Calcium: 9.2 mg/dL (ref 8.7–10.2)
Chloride: 105 mmol/L (ref 96–106)
Creatinine, Ser: 0.93 mg/dL (ref 0.76–1.27)
GFR calc Af Amer: 121 mL/min/{1.73_m2} (ref >59)
GFR calc non Af Amer: 105 mL/min/{1.73_m2} (ref >59)
Globulin, Total: 2.2 g/dL (ref 1.5–4.5)
Glucose: 101 mg/dL — ABNORMAL HIGH (ref 65–99)
Potassium: 4.1 mmol/L (ref 3.5–5.2)
Sodium: 141 mmol/L (ref 134–144)
Total Protein: 7.2 g/dL (ref 6.0–8.5)

## 2019-01-07 LAB — CBC WITH DIFFERENTIAL/PLATELET
Basophils Absolute: 0.1 10*3/uL (ref 0.0–0.2)
Basos: 1 %
EOS (ABSOLUTE): 0.2 10*3/uL (ref 0.0–0.4)
Eos: 2 %
Hematocrit: 44.6 % (ref 37.5–51.0)
Hemoglobin: 15.1 g/dL (ref 13.0–17.7)
Immature Grans (Abs): 0 10*3/uL (ref 0.0–0.1)
Immature Granulocytes: 1 %
Lymphocytes Absolute: 1.8 10*3/uL (ref 0.7–3.1)
Lymphs: 22 %
MCH: 28.5 pg (ref 26.6–33.0)
MCHC: 33.9 g/dL (ref 31.5–35.7)
MCV: 84 fL (ref 79–97)
Monocytes Absolute: 0.4 10*3/uL (ref 0.1–0.9)
Monocytes: 5 %
Neutrophils Absolute: 5.5 10*3/uL (ref 1.4–7.0)
Neutrophils: 69 %
Platelets: 206 10*3/uL (ref 150–450)
RBC: 5.3 x10E6/uL (ref 4.14–5.80)
RDW: 13.5 % (ref 11.6–15.4)
WBC: 7.9 10*3/uL (ref 3.4–10.8)

## 2019-01-07 LAB — LIPID PANEL
Chol/HDL Ratio: 3.1 ratio (ref 0.0–5.0)
Cholesterol, Total: 96 mg/dL — ABNORMAL LOW (ref 100–199)
HDL: 31 mg/dL — ABNORMAL LOW (ref >39)
LDL Calculated: 45 mg/dL (ref 0–99)
Triglycerides: 102 mg/dL (ref 0–149)
VLDL Cholesterol Cal: 20 mg/dL (ref 5–40)

## 2019-01-07 LAB — TSH: TSH: 1.63 u[IU]/mL (ref 0.450–4.500)

## 2019-01-07 LAB — HEMOGLOBIN A1C
Est. average glucose Bld gHb Est-mCnc: 105 mg/dL
Hgb A1c MFr Bld: 5.3 % (ref 4.8–5.6)

## 2019-01-07 NOTE — Progress Notes (Signed)
Mild abnormalities in glucose, Albumin/Globulin Ratio, ALT, and lipids. These are likely not of clinical significance. Will continue to monitor at regular intervals. Overall, lab results not concerning at this time.  Kathrin Ruddy, NP

## 2019-02-13 DIAGNOSIS — D1723 Benign lipomatous neoplasm of skin and subcutaneous tissue of right leg: Secondary | ICD-10-CM | POA: Diagnosis not present

## 2019-03-24 DIAGNOSIS — D1723 Benign lipomatous neoplasm of skin and subcutaneous tissue of right leg: Secondary | ICD-10-CM | POA: Diagnosis not present

## 2020-06-06 IMAGING — DX DG ANKLE COMPLETE 3+V*R*
3 series · 3 of 3 positions shown · non-contrast
Comparison: None.

CLINICAL DATA: Fell yesterday.  Ankle pain.

EXAM:
RIGHT ANKLE - COMPLETE 3+ VIEW

[ankle ap]
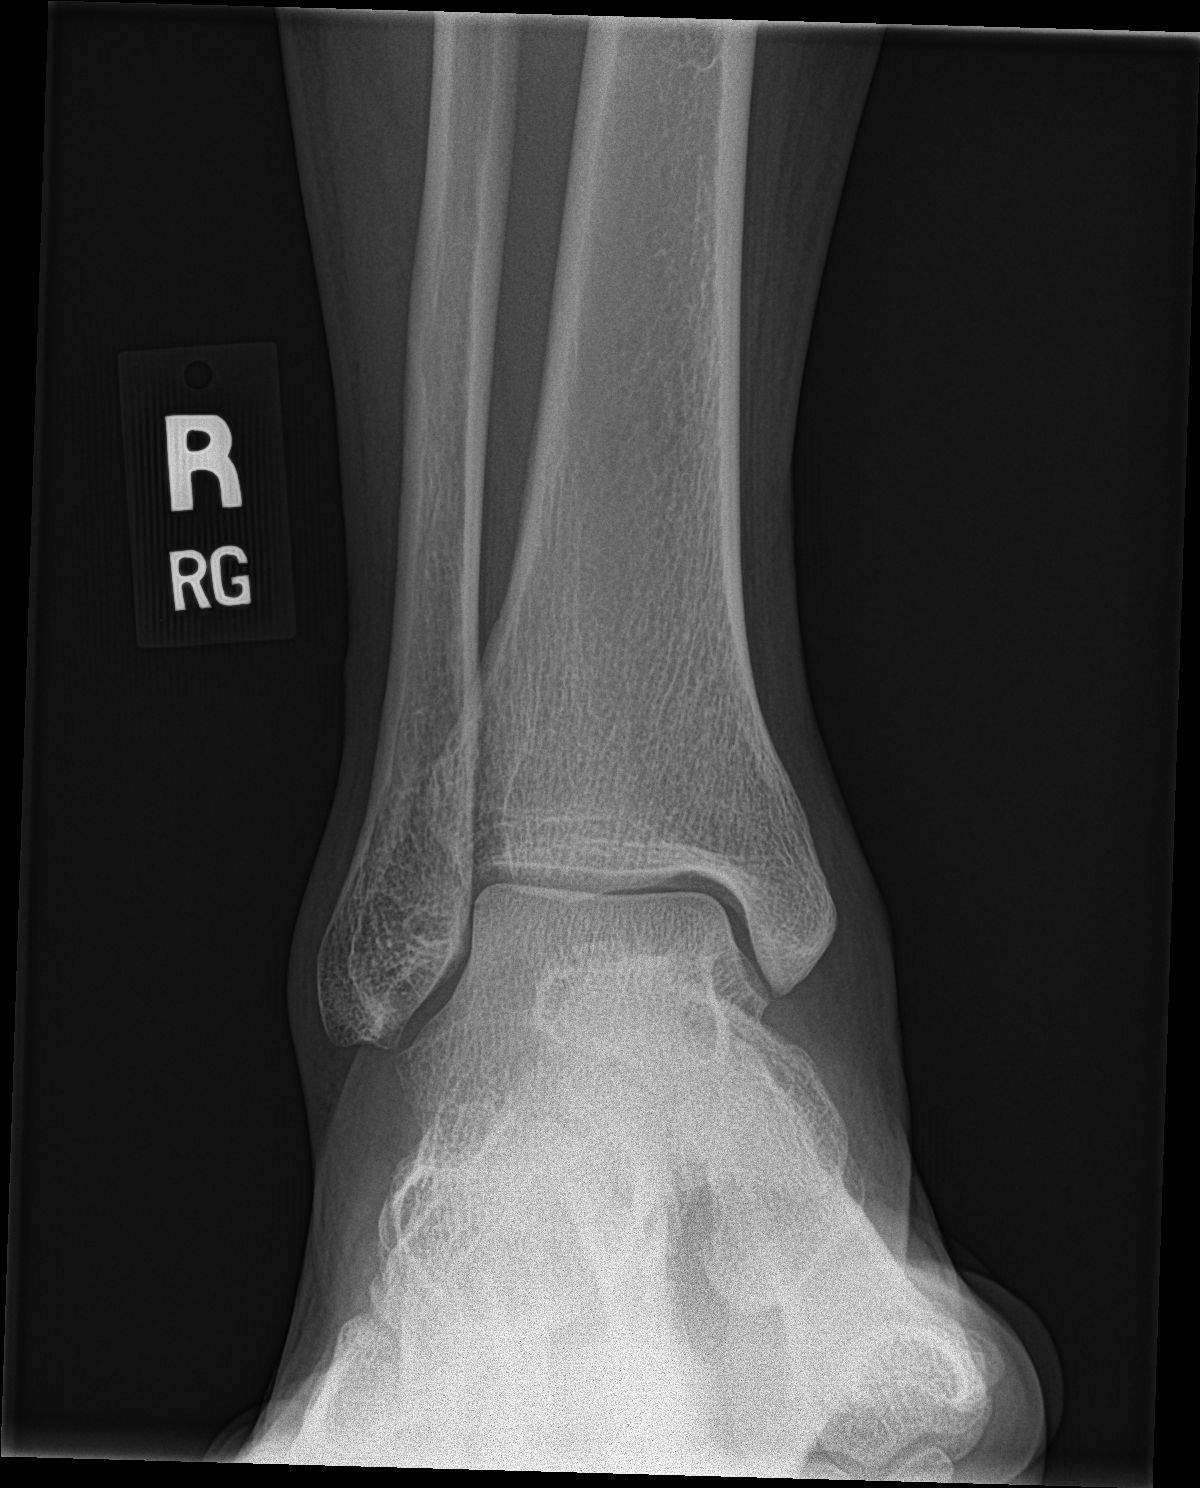

[ankle obl]
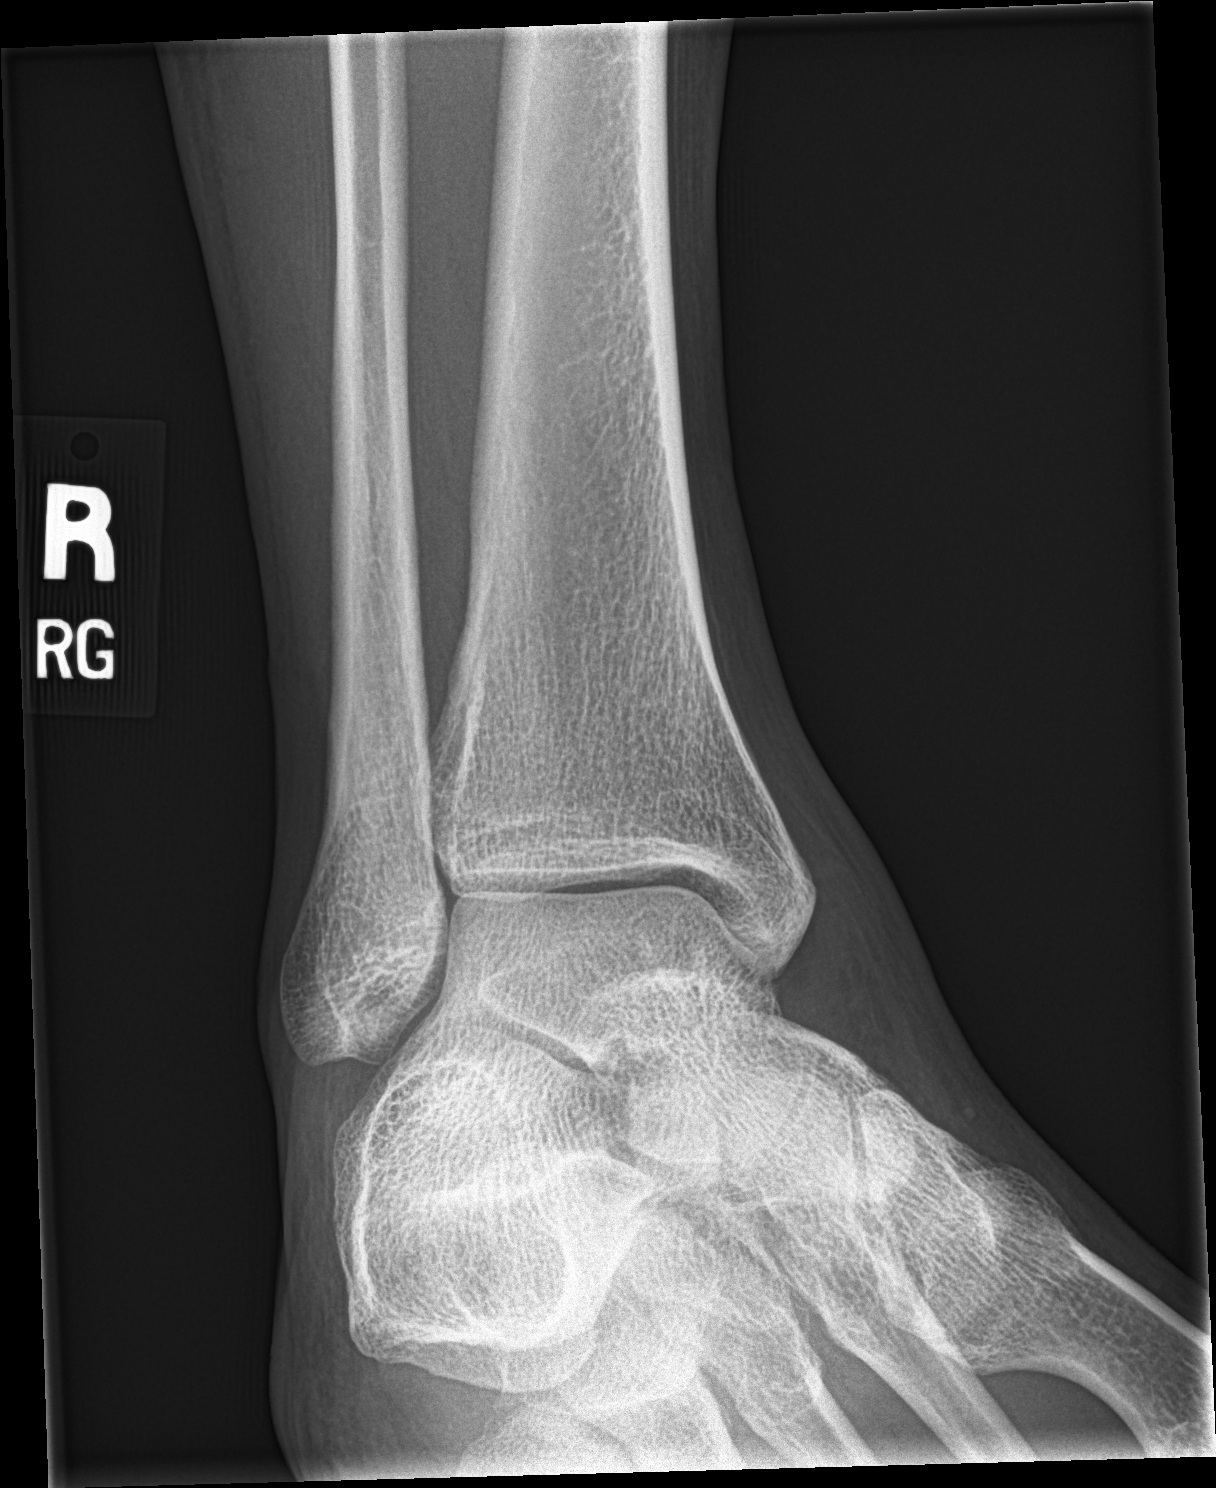

[ankle lat]
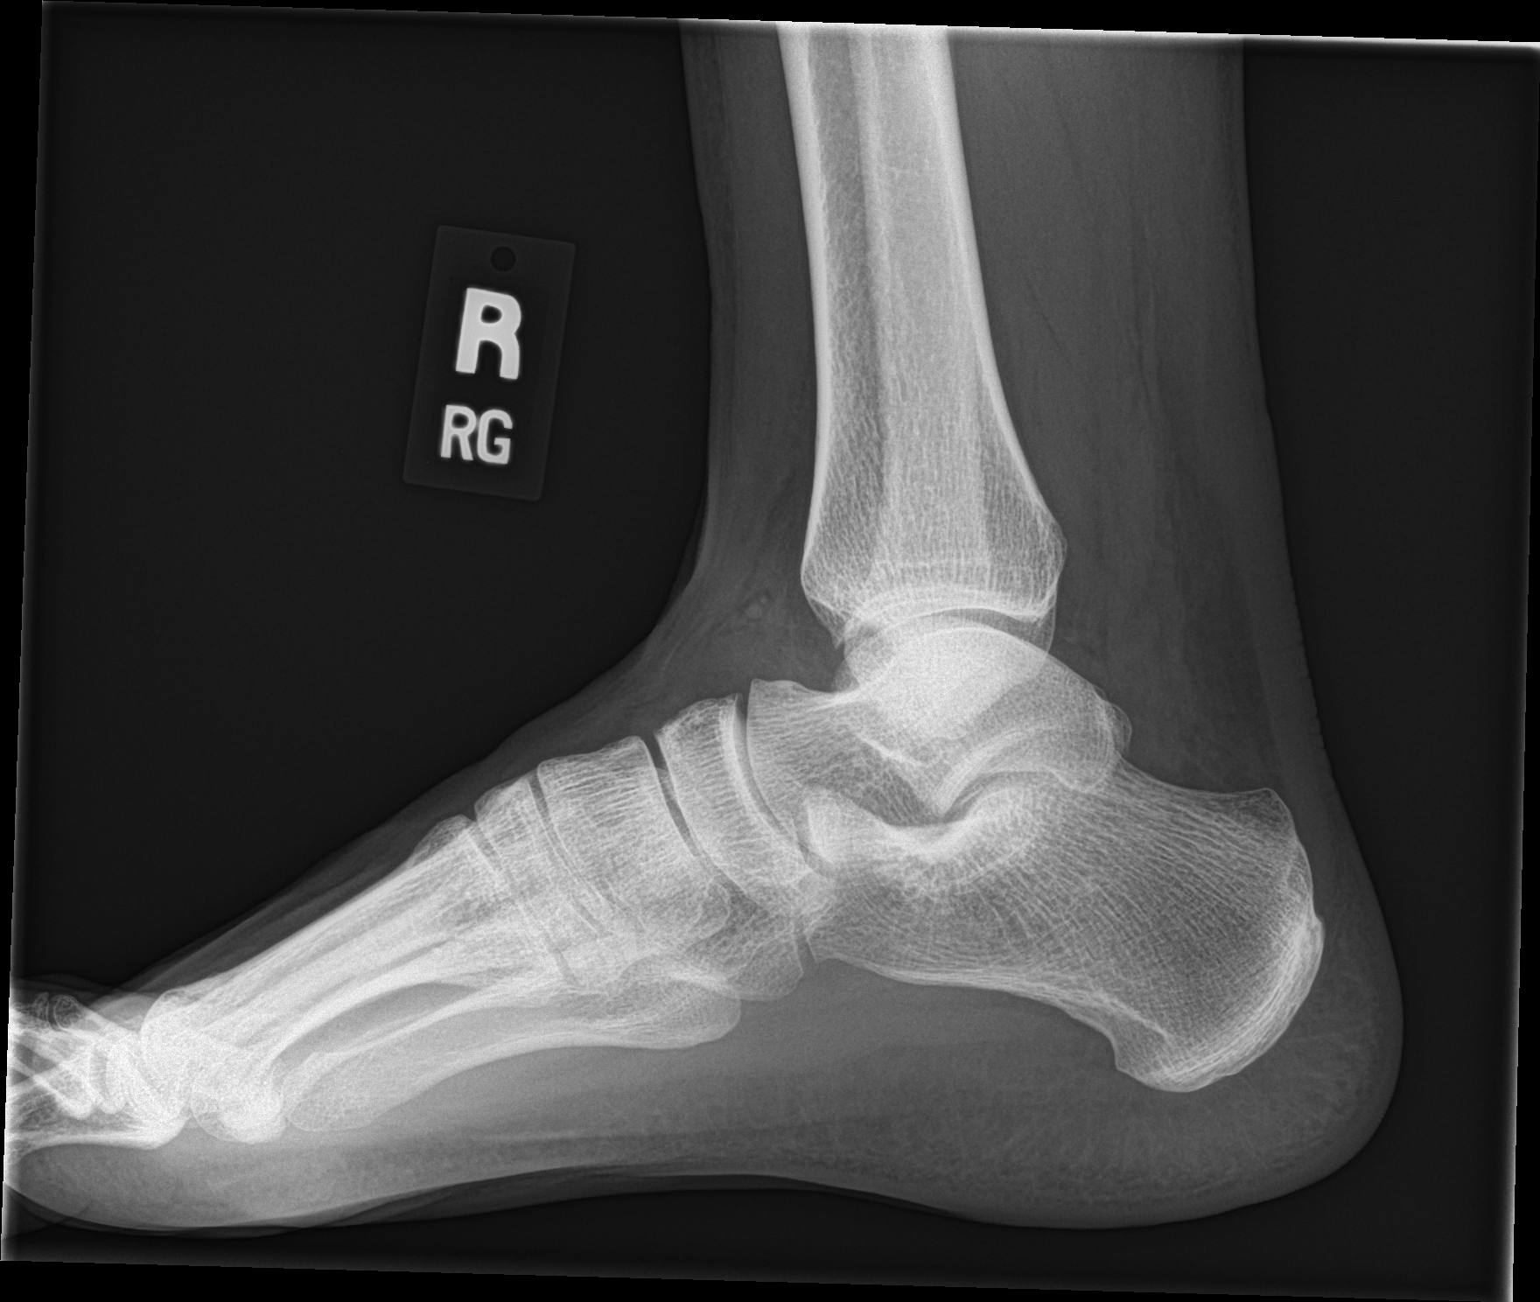

[3 of 3 positions shown; findings below may reference images not displayed]

FINDINGS: There is no evidence of fracture, dislocation, or joint effusion.
There is no evidence of arthropathy or other focal bone abnormality.
Soft tissues are unremarkable.
IMPRESSION: Negative.

## 2021-08-29 ENCOUNTER — Ambulatory Visit: Payer: Self-pay | Admitting: Nurse Practitioner

## 2022-03-23 ENCOUNTER — Encounter: Payer: Self-pay | Admitting: Family Medicine

## 2022-03-23 ENCOUNTER — Ambulatory Visit (INDEPENDENT_AMBULATORY_CARE_PROVIDER_SITE_OTHER): Payer: Self-pay | Admitting: Family Medicine

## 2022-03-23 VITALS — BP 136/80 | HR 46 | Temp 97.3°F | Ht 66.5 in | Wt 184.8 lb

## 2022-03-23 DIAGNOSIS — R5383 Other fatigue: Secondary | ICD-10-CM

## 2022-03-23 DIAGNOSIS — F341 Dysthymic disorder: Secondary | ICD-10-CM

## 2022-03-23 DIAGNOSIS — Z0000 Encounter for general adult medical examination without abnormal findings: Secondary | ICD-10-CM

## 2022-03-23 DIAGNOSIS — R0683 Snoring: Secondary | ICD-10-CM

## 2022-03-23 LAB — COMPREHENSIVE METABOLIC PANEL
ALT: 28 U/L (ref 0–53)
AST: 15 U/L (ref 0–37)
Albumin: 4.9 g/dL (ref 3.5–5.2)
Alkaline Phosphatase: 54 U/L (ref 39–117)
BUN: 13 mg/dL (ref 6–23)
CO2: 28 mEq/L (ref 19–32)
Calcium: 9.5 mg/dL (ref 8.4–10.5)
Chloride: 104 mEq/L (ref 96–112)
Creatinine, Ser: 1.08 mg/dL (ref 0.40–1.50)
GFR: 85.81 mL/min (ref >60.00)
Glucose, Bld: 102 mg/dL — ABNORMAL HIGH (ref 70–99)
Potassium: 4 mEq/L (ref 3.5–5.1)
Sodium: 139 mEq/L (ref 135–145)
Total Bilirubin: 0.6 mg/dL (ref 0.2–1.2)
Total Protein: 7.4 g/dL (ref 6.0–8.3)

## 2022-03-23 LAB — URINALYSIS, ROUTINE W REFLEX MICROSCOPIC
Bilirubin Urine: NEGATIVE
Hgb urine dipstick: NEGATIVE
Ketones, ur: NEGATIVE
Leukocytes,Ua: NEGATIVE
Nitrite: NEGATIVE
RBC / HPF: NONE SEEN (ref 0–?)
Specific Gravity, Urine: <=1.005 — AB (ref 1.000–1.030)
Total Protein, Urine: NEGATIVE
Urine Glucose: NEGATIVE
Urobilinogen, UA: 0.2 (ref 0.0–1.0)
WBC, UA: NONE SEEN (ref 0–?)
pH: 6.5 (ref 5.0–8.0)

## 2022-03-23 LAB — CBC
HCT: 42.3 % (ref 39.0–52.0)
Hemoglobin: 14.8 g/dL (ref 13.0–17.0)
MCHC: 35 g/dL (ref 30.0–36.0)
MCV: 81.6 fl (ref 78.0–100.0)
Platelets: 196 10*3/uL (ref 150.0–400.0)
RBC: 5.18 Mil/uL (ref 4.22–5.81)
RDW: 13.7 % (ref 11.5–15.5)
WBC: 6.2 10*3/uL (ref 4.0–10.5)

## 2022-03-23 LAB — TSH: TSH: 3.49 u[IU]/mL (ref 0.35–5.50)

## 2022-03-23 LAB — LIPID PANEL
Cholesterol: 102 mg/dL (ref 0–200)
HDL: 35.4 mg/dL — ABNORMAL LOW (ref >39.00)
LDL Cholesterol: 49 mg/dL (ref 0–99)
NonHDL: 66.59
Total CHOL/HDL Ratio: 3
Triglycerides: 87 mg/dL (ref 0.0–149.0)
VLDL: 17.4 mg/dL (ref 0.0–40.0)

## 2022-03-23 LAB — HEMOGLOBIN A1C: Hgb A1c MFr Bld: 5.5 % (ref 4.6–6.5)

## 2022-03-23 MED ORDER — ESCITALOPRAM OXALATE 10 MG PO TABS: 10.0000 mg | ORAL_TABLET | Freq: Every day | ORAL | 1 refills | Status: DC

## 2022-03-23 NOTE — Progress Notes (Signed)
Established Patient Office Visit  Subjective   Patient ID: Adam Rojas, male    DOB: Sep 09, 1980  Age: 41 y.o. MRN: [id number]  Chief Complaint  Patient presents with   Establish Care    Np est care pt is fasting pt feel fatigue.     HPI here fasting for physical exam and to establish care.  Mostly doing well.  Reports some ongoing fatigue.  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  He has an extra job working Friday nights on occasion.  He works 45 to 50 hours weekly.  No regular exercise or dental care.  He is quite active on his job.  He does snore.  His wife is complaining.  She has witnessed difficulty  breathing.  He does not feel rested in the morning.  He admits to feelings of sadness and loss of interest.  He is anxious and worries at times.    Review of Systems  Constitutional:  Positive for malaise/fatigue.  HENT: Negative.    Eyes:  Negative for blurred vision, discharge and redness.  Respiratory: Negative.    Cardiovascular: Negative.   Gastrointestinal:  Negative for abdominal pain.  Genitourinary: Negative.   Musculoskeletal: Negative.  Negative for myalgias.  Skin:  Negative for rash.  Neurological:  Negative for tingling, loss of consciousness and weakness.  Endo/Heme/Allergies:  Negative for polydipsia.      03/23/2022    9:47 AM 03/23/2022    9:19 AM 01/06/2019    2:58 PM  Depression screen PHQ 2/9  Decreased Interest 1 1 0  Down, Depressed, Hopeless 1 1 0  PHQ - 2 Score 2 2 0  Tired, decreased energy 1    Change in appetite 0    Feeling bad or failure about yourself  0    Trouble concentrating 1    Moving slowly or fidgety/restless 0    Suicidal thoughts 0    Difficult doing work/chores Somewhat difficult         Objective:     BP 136/80 (BP Location: Right Arm, Patient Position: Sitting, Cuff Size: Normal)   Pulse (!) 46   Temp (!) 97.3 F (36.3 C) (Temporal)   Ht 5' 6.5" (1.689 m)   Wt 184 lb 12.8 oz (83.8 kg)   SpO2 99%   BMI  29.38 kg/m  Wt Readings from Last 3 Encounters:  03/23/22 184 lb 12.8 oz (83.8 kg)  01/06/19 180 lb (81.6 kg)  05/20/18 179 lb 12.8 oz (81.6 kg)      Physical Exam Constitutional:      General: He is not in acute distress.    Appearance: Normal appearance. He is not ill-appearing, toxic-appearing or diaphoretic.  HENT:     Head: Normocephalic and atraumatic.     Right Ear: External ear normal.     Left Ear: External ear normal.     Mouth/Throat:     Mouth: Mucous membranes are moist.     Pharynx: Oropharynx is clear. No oropharyngeal exudate or posterior oropharyngeal erythema.  Eyes:     General: No scleral icterus.       Right eye: No discharge.        Left eye: No discharge.     Extraocular Movements: Extraocular movements intact.     Conjunctiva/sclera: Conjunctivae normal.     Pupils: Pupils are equal, round, and reactive to light.  Cardiovascular:     Rate and Rhythm: Normal rate and regular rhythm.  Pulmonary:  Effort: Pulmonary effort is normal. No respiratory distress.     Breath sounds: Normal breath sounds.  Abdominal:     General: Bowel sounds are normal. There is no distension.     Tenderness: There is no abdominal tenderness. There is no guarding.     Hernia: There is no hernia in the left inguinal area or right inguinal area.  Genitourinary:    Penis: Uncircumcised. No phimosis, paraphimosis, hypospadias, erythema, tenderness, discharge, swelling or lesions.      Testes:        Right: Mass, tenderness or swelling not present. Right testis is descended.        Left: Mass, tenderness or swelling not present. Left testis is descended.     Epididymis:     Right: Not inflamed or enlarged.     Left: Not inflamed or enlarged.  Musculoskeletal:     Cervical back: No rigidity or tenderness.  Lymphadenopathy:     Lower Body: No right inguinal adenopathy. No left inguinal adenopathy.  Skin:    General: Skin is warm and dry.  Neurological:     Mental Status:  He is alert and oriented to person, place, and time.  Psychiatric:        Mood and Affect: Mood normal.        Behavior: Behavior normal.      No results found for any visits on 03/23/22.    The ASCVD Risk score (Arnett DK, et al., 2019) failed to calculate for the following reasons:   Cannot find a previous HDL lab   Cannot find a previous total cholesterol lab    Assessment & Plan:   Problem List Items Addressed This Visit   None Visit Diagnoses     Healthcare maintenance    -  Primary   Relevant Orders   CBC   Comprehensive metabolic panel   Lipid panel   Urinalysis, Routine w reflex microscopic   Snores       Relevant Orders   Ambulatory referral to Sleep Studies   Dysthymic disorder       Relevant Medications   escitalopram (LEXAPRO) 10 MG tablet   Other fatigue       Relevant Orders   Hemoglobin A1c   TSH       Return in about 6 weeks (around 05/04/2022).  Information was given on Lexapro and sleep apnea.  Would like to try Lexapro.  We will adjust time of day taking.  Follow-up in 4 to 6 weeks.  Agrees to go for consultation for sleep study.  Believe that his fatigue could be a combination dysthymia with anxiety, poor sleep quality and stress.  Encouraged regular exercise.  We will still hold off on the flu shot until next visit.  Libby Maw, MD

## 2022-05-04 ENCOUNTER — Ambulatory Visit (INDEPENDENT_AMBULATORY_CARE_PROVIDER_SITE_OTHER): Payer: Self-pay | Admitting: Family Medicine

## 2022-05-04 ENCOUNTER — Encounter: Payer: Self-pay | Admitting: Family Medicine

## 2022-05-04 VITALS — BP 128/84 | HR 54 | Temp 97.2°F | Ht 66.0 in | Wt 186.6 lb

## 2022-05-04 DIAGNOSIS — R0683 Snoring: Secondary | ICD-10-CM

## 2022-05-04 DIAGNOSIS — F341 Dysthymic disorder: Secondary | ICD-10-CM

## 2022-05-04 MED ORDER — ESCITALOPRAM OXALATE 10 MG PO TABS: 10.0000 mg | ORAL_TABLET | Freq: Every day | ORAL | 2 refills | Status: DC

## 2022-05-04 NOTE — Progress Notes (Addendum)
Established Patient Office Visit  Subjective   Patient ID: Adam Rojas, male    DOB: Nov 17, 1980  Age: 41 y.o. MRN: [id number]  Chief Complaint  Patient presents with   Follow-up    6 week follow up, no concerns. Patient fasting.     HPI follow-up of dysthymia and anxiety.  Doing well with the Lexapro.  Sleep is improved and stress levels seem to be down.  Would like to continue it.  He has taken it in the evening.  Lab work drawn at last visit looked great.  Discussed that.  Patient is in a monogamous relationship and has never used self injected drugs.  Not interested in screening for hep C or HIV.    Review of Systems  Constitutional: Negative.   HENT: Negative.    Eyes:  Negative for blurred vision, discharge and redness.  Respiratory: Negative.    Cardiovascular: Negative.   Gastrointestinal:  Negative for abdominal pain.  Genitourinary: Negative.   Musculoskeletal: Negative.  Negative for myalgias.  Skin:  Negative for rash.  Neurological:  Negative for tingling, loss of consciousness and weakness.  Endo/Heme/Allergies:  Negative for polydipsia.      05/04/2022    8:57 AM 03/23/2022    9:47 AM 03/23/2022    9:19 AM  Depression screen PHQ 2/9  Decreased Interest 0 1 1  Down, Depressed, Hopeless 0 1 1  PHQ - 2 Score 0 2 2  Tired, decreased energy  1   Change in appetite  0   Feeling bad or failure about yourself   0   Trouble concentrating  1   Moving slowly or fidgety/restless  0   Suicidal thoughts  0   Difficult doing work/chores Not difficult at all Somewhat difficult        Objective:     BP 128/84 (BP Location: Right Arm, Patient Position: Sitting, Cuff Size: Normal)   Pulse (!) 54   Temp (!) 97.2 F (36.2 C) (Temporal)   Ht 5\' 6"  (1.676 m)   Wt 186 lb 9.6 oz (84.6 kg)   SpO2 97%   BMI 30.12 kg/m    Physical Exam Constitutional:      General: He is not in acute distress.    Appearance: Normal appearance. He is not ill-appearing,  toxic-appearing or diaphoretic.  HENT:     Head: Normocephalic and atraumatic.     Right Ear: External ear normal.     Left Ear: External ear normal.  Eyes:     General: No scleral icterus.       Right eye: No discharge.        Left eye: No discharge.     Extraocular Movements: Extraocular movements intact.     Conjunctiva/sclera: Conjunctivae normal.  Pulmonary:     Effort: Pulmonary effort is normal. No respiratory distress.  Skin:    General: Skin is warm and dry.  Neurological:     Mental Status: He is alert and oriented to person, place, and time.  Psychiatric:        Mood and Affect: Mood normal.        Behavior: Behavior normal.      No results found for any visits on 05/04/22.    The ASCVD Risk score (Arnett DK, et al., 2019) failed to calculate for the following reasons:   The valid total cholesterol range is 130 to 320 mg/dL    Assessment & Plan:   Problem List Items Addressed This Visit  None Visit Diagnoses     Snores    -  Primary   Dysthymic disorder       Relevant Medications   escitalopram (LEXAPRO) 10 MG tablet       Return in about 6 months (around 11/02/2022), or if symptoms worsen or fail to improve.  Continue Lexapro.  We will establish MyChart account to check for message from sleep doctor.  Mliss Sax, MD

## 2022-05-25 ENCOUNTER — Other Ambulatory Visit: Payer: Self-pay | Admitting: Family Medicine

## 2022-05-25 DIAGNOSIS — F341 Dysthymic disorder: Secondary | ICD-10-CM

## 2022-10-30 ENCOUNTER — Ambulatory Visit: Payer: Self-pay | Admitting: Family Medicine

## 2022-11-21 ENCOUNTER — Ambulatory Visit (INDEPENDENT_AMBULATORY_CARE_PROVIDER_SITE_OTHER): Payer: Self-pay | Admitting: Family Medicine

## 2022-11-21 ENCOUNTER — Encounter: Payer: Self-pay | Admitting: Family Medicine

## 2022-11-21 VITALS — BP 134/70 | HR 51 | Temp 98.7°F | Ht 66.0 in | Wt 186.4 lb

## 2022-11-21 DIAGNOSIS — M2550 Pain in unspecified joint: Secondary | ICD-10-CM | POA: Insufficient documentation

## 2022-11-21 DIAGNOSIS — F341 Dysthymic disorder: Secondary | ICD-10-CM | POA: Insufficient documentation

## 2022-11-21 DIAGNOSIS — Z23 Encounter for immunization: Secondary | ICD-10-CM | POA: Insufficient documentation

## 2022-11-21 MED ORDER — ESCITALOPRAM OXALATE 10 MG PO TABS: ORAL_TABLET | ORAL | 2 refills | Status: DC

## 2022-11-21 NOTE — Progress Notes (Unsigned)
Established Patient Office Visit   Subjective:  Patient ID: Adam Rojas, male    DOB: [birth year]  Age: 42 y.o. MRN: [id number]  Chief Complaint  Patient presents with   Medical Management of Chronic Issues    6 month follow up, no concerns. Patient fasting.     HPI Encounter Diagnoses  Name Primary?   Immunization due Yes   Dysthymic disorder    Arthralgia, unspecified joint    Follow-up of dysthymic disorder with anxiety.  Symptoms are greatly relieved with the Lexapro.  He would like to continue it.  He has found that sleeping on his side reduces his snoring greatly.  Says that his wife is okay with his snoring for now.  Has occasional joint aches and pains and wonders what to take. {History (Optional):23778}  Review of Systems  Constitutional: Negative.   HENT: Negative.    Eyes:  Negative for blurred vision, discharge and redness.  Respiratory: Negative.    Cardiovascular: Negative.   Gastrointestinal:  Negative for abdominal pain.  Genitourinary: Negative.   Musculoskeletal:  Positive for joint pain. Negative for myalgias.  Skin:  Negative for rash.  Neurological:  Negative for tingling, loss of consciousness and weakness.  Endo/Heme/Allergies:  Negative for polydipsia.      11/21/2022    8:12 AM 05/04/2022    9:44 AM 05/04/2022    8:57 AM  Depression screen PHQ 2/9  Decreased Interest 0 0 0  Down, Depressed, Hopeless 0 0 0  PHQ - 2 Score 0 0 0  Altered sleeping  1   Tired, decreased energy  1   Change in appetite  0   Feeling bad or failure about yourself   0   Trouble concentrating  1   Moving slowly or fidgety/restless  0   Suicidal thoughts  0   PHQ-9 Score  3   Difficult doing work/chores  Not difficult at all Not difficult at all      Current Outpatient Medications:    escitalopram (LEXAPRO) 10 MG tablet, TAKE 1 TABLET(10 MG) BY MOUTH DAILY, Disp: 90 tablet, Rfl: 2   Objective:     BP 134/70 (BP Location: Right Arm, Patient Position:  Sitting, Cuff Size: Large)   Pulse (!) 51   Temp 98.7 F (37.1 C) (Temporal)   Ht 5\' 6"  (1.676 m)   Wt 186 lb 6.4 oz (84.6 kg)   SpO2 95%   BMI 30.09 kg/m  {Vitals History (Optional):23777}  Physical Exam Constitutional:      General: He is not in acute distress.    Appearance: Normal appearance. He is not ill-appearing, toxic-appearing or diaphoretic.  HENT:     Head: Normocephalic and atraumatic.     Right Ear: External ear normal.     Left Ear: External ear normal.  Eyes:     General: No scleral icterus.       Right eye: No discharge.        Left eye: No discharge.     Extraocular Movements: Extraocular movements intact.     Conjunctiva/sclera: Conjunctivae normal.  Pulmonary:     Effort: Pulmonary effort is normal. No respiratory distress.  Skin:    General: Skin is warm and dry.  Neurological:     Mental Status: He is alert and oriented to person, place, and time.  Psychiatric:        Mood and Affect: Mood normal.        Behavior: Behavior normal.  No results found for any visits on 11/21/22.  {Labs (Optional):23779}  The ASCVD Risk score (Arnett DK, et al., 2019) failed to calculate for the following reasons:   The valid total cholesterol range is 130 to 320 mg/dL    Assessment & Plan:   Immunization due -     Tdap vaccine greater than or equal to 7yo IM  Dysthymic disorder -     Escitalopram Oxalate; TAKE 1 TABLET(10 MG) BY MOUTH DAILY  Dispense: 90 tablet; Refill: 2  Arthralgia, unspecified joint    Return in about 1 year (around 11/21/2023), or if symptoms worsen or fail to improve.  Will use Tylenol as needed for various joint aches and pains.  Will follow-up if this does not offer relief.  Continue Lexapro daily at 10 mg.  Hold on referral to sleep medicine.  Tdap today.  Mliss Sax, MD

## 2023-11-21 ENCOUNTER — Ambulatory Visit: Payer: Self-pay | Admitting: Family Medicine

## 2023-11-30 ENCOUNTER — Encounter: Payer: Self-pay | Admitting: Family Medicine

## 2023-11-30 ENCOUNTER — Ambulatory Visit: Payer: Self-pay | Admitting: Family Medicine

## 2023-11-30 ENCOUNTER — Ambulatory Visit (INDEPENDENT_AMBULATORY_CARE_PROVIDER_SITE_OTHER): Payer: Self-pay | Admitting: Family Medicine

## 2023-11-30 VITALS — BP 120/80 | HR 64 | Temp 97.1°F | Ht 66.0 in | Wt 188.0 lb

## 2023-11-30 DIAGNOSIS — M7711 Lateral epicondylitis, right elbow: Secondary | ICD-10-CM

## 2023-11-30 DIAGNOSIS — Z13220 Encounter for screening for lipoid disorders: Secondary | ICD-10-CM

## 2023-11-30 DIAGNOSIS — F341 Dysthymic disorder: Secondary | ICD-10-CM

## 2023-11-30 DIAGNOSIS — Z131 Encounter for screening for diabetes mellitus: Secondary | ICD-10-CM

## 2023-11-30 DIAGNOSIS — Z0000 Encounter for general adult medical examination without abnormal findings: Secondary | ICD-10-CM

## 2023-11-30 LAB — LIPID PANEL
Cholesterol: 107 mg/dL (ref 0–200)
HDL: 33.9 mg/dL — ABNORMAL LOW (ref >39.00)
LDL Cholesterol: 61 mg/dL (ref 0–99)
NonHDL: 72.8
Total CHOL/HDL Ratio: 3
Triglycerides: 60 mg/dL (ref 0.0–149.0)
VLDL: 12 mg/dL (ref 0.0–40.0)

## 2023-11-30 LAB — HEMOGLOBIN A1C: Hgb A1c MFr Bld: 5.4 % (ref 4.6–6.5)

## 2023-11-30 LAB — URINALYSIS, ROUTINE W REFLEX MICROSCOPIC
Bilirubin Urine: NEGATIVE
Hgb urine dipstick: NEGATIVE
Ketones, ur: NEGATIVE
Leukocytes,Ua: NEGATIVE
Nitrite: NEGATIVE
RBC / HPF: NONE SEEN (ref 0–?)
Specific Gravity, Urine: 1.015 (ref 1.000–1.030)
Total Protein, Urine: NEGATIVE
Urine Glucose: NEGATIVE
Urobilinogen, UA: 0.2 (ref 0.0–1.0)
WBC, UA: NONE SEEN (ref 0–?)
pH: 8 (ref 5.0–8.0)

## 2023-11-30 LAB — COMPREHENSIVE METABOLIC PANEL WITH GFR
ALT: 41 U/L (ref 0–53)
AST: 19 U/L (ref 0–37)
Albumin: 4.9 g/dL (ref 3.5–5.2)
Alkaline Phosphatase: 50 U/L (ref 39–117)
BUN: 12 mg/dL (ref 6–23)
CO2: 27 meq/L (ref 19–32)
Calcium: 9.9 mg/dL (ref 8.4–10.5)
Chloride: 104 meq/L (ref 96–112)
Creatinine, Ser: 0.96 mg/dL (ref 0.40–1.50)
GFR: 97.68 mL/min (ref >60.00)
Glucose, Bld: 147 mg/dL — ABNORMAL HIGH (ref 70–99)
Potassium: 3.8 meq/L (ref 3.5–5.1)
Sodium: 139 meq/L (ref 135–145)
Total Bilirubin: 0.8 mg/dL (ref 0.2–1.2)
Total Protein: 7.7 g/dL (ref 6.0–8.3)

## 2023-11-30 LAB — CBC
HCT: 44.9 % (ref 39.0–52.0)
Hemoglobin: 15.5 g/dL (ref 13.0–17.0)
MCHC: 34.5 g/dL (ref 30.0–36.0)
MCV: 81.3 fl (ref 78.0–100.0)
Platelets: 229 10*3/uL (ref 150.0–400.0)
RBC: 5.53 Mil/uL (ref 4.22–5.81)
RDW: 13.3 % (ref 11.5–15.5)
WBC: 8 10*3/uL (ref 4.0–10.5)

## 2023-11-30 MED ORDER — ESCITALOPRAM OXALATE 10 MG PO TABS
ORAL_TABLET | ORAL | 2 refills | Status: AC
Start: 2023-11-30 — End: ?

## 2023-11-30 NOTE — Progress Notes (Signed)
 Established Patient Office Visit   Subjective:  Patient ID: Adam Rojas, male    DOB: December 14, 1980  Age: 43 y.o. MRN: [id number]  Chief Complaint  Patient presents with   Follow-up    Patient is fasting also has some concerns with right hand pain arthralgia    HPI Encounter Diagnoses  Name Primary?   Healthcare maintenance Yes   Screening for cholesterol level    Screening for diabetes mellitus    Dysthymic disorder    Lateral epicondylitis of right elbow    For physical and follow-up of above.  Doing well with the Lexapro .  It is helpful.  He would like to continue it.  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  He does have regular dental care.  Right-hand-dominant with ongoing pain in his elbow.   Review of Systems  Constitutional: Negative.   HENT: Negative.    Eyes:  Negative for blurred vision, discharge and redness.  Respiratory: Negative.    Cardiovascular: Negative.   Gastrointestinal:  Negative for abdominal pain.  Genitourinary: Negative.   Musculoskeletal:  Positive for joint pain. Negative for myalgias.  Skin:  Negative for rash.  Neurological:  Negative for tingling, loss of consciousness and weakness.  Endo/Heme/Allergies:  Negative for polydipsia.     Current Outpatient Medications:    escitalopram  (LEXAPRO ) 10 MG tablet, TAKE 1 TABLET(10 MG) BY MOUTH DAILY, Disp: 90 tablet, Rfl: 2   Objective:     BP 120/80 (BP Location: Left Arm, Patient Position: Sitting, Cuff Size: Normal)   Pulse 64   Temp (!) 97.1 F (36.2 C) (Temporal)   Ht 5' 6 (1.676 m)   Wt 188 lb (85.3 kg)   SpO2 94%   BMI 30.34 kg/m  Wt Readings from Last 3 Encounters:  11/30/23 188 lb (85.3 kg)  11/21/22 186 lb 6.4 oz (84.6 kg)  05/04/22 186 lb 9.6 oz (84.6 kg)      Physical Exam Constitutional:      General: He is not in acute distress.    Appearance: Normal appearance. He is not ill-appearing, toxic-appearing or diaphoretic.  HENT:     Head:  Normocephalic and atraumatic.     Right Ear: Tympanic membrane, ear canal and external ear normal.     Left Ear: Tympanic membrane, ear canal and external ear normal.     Mouth/Throat:     Mouth: Mucous membranes are moist.     Pharynx: Oropharynx is clear. No oropharyngeal exudate or posterior oropharyngeal erythema.   Eyes:     General: No scleral icterus.       Right eye: No discharge.        Left eye: No discharge.     Extraocular Movements: Extraocular movements intact.     Conjunctiva/sclera: Conjunctivae normal.     Pupils: Pupils are equal, round, and reactive to light.    Cardiovascular:     Rate and Rhythm: Normal rate and regular rhythm.  Pulmonary:     Effort: Pulmonary effort is normal. No respiratory distress.     Breath sounds: Normal breath sounds.  Abdominal:     General: Bowel sounds are normal.     Tenderness: There is no abdominal tenderness. There is no guarding.     Hernia: There is no hernia in the left inguinal area or right inguinal area.  Genitourinary:    Penis: Uncircumcised. No phimosis, paraphimosis, hypospadias, erythema, tenderness, discharge, swelling or lesions.      Testes:  Right: Mass, tenderness or swelling not present. Right testis is descended.        Left: Mass, tenderness or swelling not present. Left testis is descended.     Epididymis:     Right: Not inflamed or enlarged.     Left: Not inflamed or enlarged.   Musculoskeletal:     Right elbow: No swelling. Normal range of motion. Tenderness present.       Arms:     Cervical back: No rigidity or tenderness.  Lymphadenopathy:     Lower Body: No right inguinal adenopathy. No left inguinal adenopathy.   Skin:    General: Skin is warm and dry.   Neurological:     Mental Status: He is alert and oriented to person, place, and time.   Psychiatric:        Mood and Affect: Mood normal.        Behavior: Behavior normal.      No results found for any visits on  11/30/23.    The ASCVD Risk score (Arnett DK, et al., 2019) failed to calculate for the following reasons:   The valid total cholesterol range is 130 to 320 mg/dL    Assessment & Plan:   Healthcare maintenance -     CBC -     Urinalysis, Routine w reflex microscopic  Screening for cholesterol level -     Comprehensive metabolic panel with GFR -     Lipid panel  Screening for diabetes mellitus -     Comprehensive metabolic panel with GFR -     Hemoglobin A1c  Dysthymic disorder -     Escitalopram  Oxalate; TAKE 1 TABLET(10 MG) BY MOUTH DAILY  Dispense: 90 tablet; Refill: 2  Lateral epicondylitis of right elbow    Return in about 1 year (around 11/29/2024), or if symptoms worsen or fail to improve.  Continue healthy active lifestyle.  Information given on health maintenance and disease prevention.  Continue Lexapro  for dysthymia.  Information was given on lateral epicondylitis.  Demonstrated placement of strap.  Information was given on exercises to do.  He will use Voltaren gel.  Try to be work with palms facing upward as much as possible.  Will let me know if not improving in a month or 2.  Sports medicine referral.  Tonna Frederic, MD

## 2024-12-05 ENCOUNTER — Encounter: Payer: Self-pay | Admitting: Family Medicine
